# Patient Record
Sex: Female | Born: 1965 | Race: Black or African American | Hispanic: No | Marital: Single | Smoking: Current every day smoker
Health system: Southern US, Community
[De-identification: ages and names within clinical notes are randomized; demographics above are authoritative.]

## PROBLEM LIST (undated history)

## (undated) ENCOUNTER — Emergency Department (HOSPITAL_BASED_OUTPATIENT_CLINIC_OR_DEPARTMENT_OTHER): Admission: EM | Payer: Self-pay | Source: Home / Self Care

## (undated) DIAGNOSIS — F32A Depression, unspecified: Secondary | ICD-10-CM

## (undated) DIAGNOSIS — I10 Essential (primary) hypertension: Secondary | ICD-10-CM

## (undated) DIAGNOSIS — F329 Major depressive disorder, single episode, unspecified: Secondary | ICD-10-CM

## (undated) DIAGNOSIS — E785 Hyperlipidemia, unspecified: Secondary | ICD-10-CM

## (undated) HISTORY — PX: KNEE SURGERY: SHX244

---

## 2006-04-06 ENCOUNTER — Ambulatory Visit: Payer: Self-pay | Admitting: Family Medicine

## 2006-04-06 ENCOUNTER — Other Ambulatory Visit: Admission: RE | Admit: 2006-04-06 | Discharge: 2006-04-06 | Payer: Self-pay | Admitting: Obstetrics and Gynecology

## 2014-01-22 ENCOUNTER — Encounter (HOSPITAL_BASED_OUTPATIENT_CLINIC_OR_DEPARTMENT_OTHER): Payer: Self-pay

## 2014-01-22 ENCOUNTER — Emergency Department (HOSPITAL_BASED_OUTPATIENT_CLINIC_OR_DEPARTMENT_OTHER): Payer: Self-pay

## 2014-01-22 ENCOUNTER — Emergency Department (HOSPITAL_BASED_OUTPATIENT_CLINIC_OR_DEPARTMENT_OTHER)
Admission: EM | Admit: 2014-01-22 | Discharge: 2014-01-22 | Disposition: A | Discharge status: Home / Self Care | Payer: Self-pay | Attending: Emergency Medicine | Admitting: Emergency Medicine

## 2014-01-22 DIAGNOSIS — W2209XA Striking against other stationary object, initial encounter: Secondary | ICD-10-CM | POA: Insufficient documentation

## 2014-01-22 DIAGNOSIS — Y998 Other external cause status: Secondary | ICD-10-CM | POA: Insufficient documentation

## 2014-01-22 DIAGNOSIS — S62396A Other fracture of fifth metacarpal bone, right hand, initial encounter for closed fracture: Secondary | ICD-10-CM | POA: Insufficient documentation

## 2014-01-22 DIAGNOSIS — E785 Hyperlipidemia, unspecified: Secondary | ICD-10-CM | POA: Insufficient documentation

## 2014-01-22 DIAGNOSIS — S62306A Unspecified fracture of fifth metacarpal bone, right hand, initial encounter for closed fracture: Secondary | ICD-10-CM

## 2014-01-22 DIAGNOSIS — Z79899 Other long term (current) drug therapy: Secondary | ICD-10-CM | POA: Insufficient documentation

## 2014-01-22 DIAGNOSIS — I10 Essential (primary) hypertension: Secondary | ICD-10-CM | POA: Insufficient documentation

## 2014-01-22 DIAGNOSIS — Y9289 Other specified places as the place of occurrence of the external cause: Secondary | ICD-10-CM | POA: Insufficient documentation

## 2014-01-22 DIAGNOSIS — Y9389 Activity, other specified: Secondary | ICD-10-CM | POA: Insufficient documentation

## 2014-01-22 DIAGNOSIS — T1490XA Injury, unspecified, initial encounter: Secondary | ICD-10-CM

## 2014-01-22 HISTORY — DX: Essential (primary) hypertension: I10

## 2014-01-22 HISTORY — DX: Hyperlipidemia, unspecified: E78.5

## 2014-01-22 MED ORDER — OXYCODONE-ACETAMINOPHEN 5-325 MG PO TABS
2.0000 | ORAL_TABLET | Freq: Once | ORAL | Status: AC
  Administered 2014-01-22: 2 via ORAL
  Filled 2014-01-22: qty 2

## 2014-01-22 MED ORDER — LISINOPRIL 10 MG PO TABS
5.0000 mg | ORAL_TABLET | Freq: Once | ORAL | Status: AC
  Administered 2014-01-22: 5 mg via ORAL
  Filled 2014-01-22: qty 1

## 2014-01-22 MED ORDER — OXYCODONE-ACETAMINOPHEN 5-325 MG PO TABS: 1.0000 | ORAL_TABLET | Freq: Four times a day (QID) | ORAL | Status: AC | PRN

## 2014-01-22 MED ORDER — LISINOPRIL 5 MG PO TABS: 5.0000 mg | ORAL_TABLET | Freq: Every day | ORAL | Status: AC

## 2014-01-22 NOTE — ED Provider Notes (Signed)
TIME SEEN: 10:55 AM  CHIEF COMPLAINT: Right hand injury  HPI: Pt is a 48 y.o. female with history of hypertension, hyperlipidemia who is right-hand-dominant who presents to the emergency department with right hand swelling and pain after she punched a wall earlier today. No other injury. No numbness or focal weakness.  Patient is hypertensive. She is out of her lisinopril. Denies chest pain, shortness of breath, headache, vision changes.  ROS: See HPI Constitutional: no fever  Eyes: no drainage  ENT: no runny nose   Cardiovascular:  no chest pain  Resp: no SOB  GI: no vomiting GU: no dysuria Integumentary: no rash  Allergy: no hives  Musculoskeletal: no leg swelling  Neurological: no slurred speech ROS otherwise negative  PAST MEDICAL HISTORY/PAST SURGICAL HISTORY:  Past Medical History  Diagnosis Date  . Hypertension   . Hyperlipidemia     MEDICATIONS:  Prior to Admission medications   Medication Sig Start Date End Date Taking? Authorizing Provider  rosuvastatin (CRESTOR) 20 MG tablet Take 20 mg by mouth daily.   Yes Historical Provider, MD  lisinopril (PRINIVIL,ZESTRIL) 5 MG tablet Take 5 mg by mouth daily.    Historical Provider, MD    ALLERGIES:  No Known Allergies  SOCIAL HISTORY:  History  Substance Use Topics  . Smoking status: Not on file  . Smokeless tobacco: Not on file  . Alcohol Use: Not on file    FAMILY HISTORY: No family history on file.  EXAM: BP 176/120 mmHg  Pulse 82  Temp(Src) 99.1 F (37.3 C) (Oral)  Resp 16  Ht 5' 6.5" (1.689 m)  Wt 160 lb (72.576 kg)  BMI 25.44 kg/m2  SpO2 98% CONSTITUTIONAL: Alert and oriented and responds appropriately to questions. Well-appearing; well-nourished HEAD: Normocephalic EYES: Conjunctivae clear, PERRL ENT: normal nose; no rhinorrhea; moist mucous membranes; pharynx without lesions noted NECK: Supple, no meningismus, no LAD  CARD: RRR; S1 and S2 appreciated; no murmurs, no clicks, no rubs, no  gallops RESP: Normal chest excursion without splinting or tachypnea; breath sounds clear and equal bilaterally; no wheezes, no rhonchi, no rales,  ABD/GI: Normal bowel sounds; non-distended; soft, non-tender, no rebound, no guarding BACK:  The back appears normal and is non-tender to palpation, there is no CVA tenderness EXT: Tenderness and swelling and ecchymosis over the dorsal right fifth metacarpal with a small associated abrasion, no scissoring when she makes a fist, normal capillary refill, 2+ radial pulses bilaterally, normal sensation diffusely throughout the hand, no other sign of bony injury, otherwise Normal ROM in all joints; otherwise extremities are non-tender to palpation; no edema; normal capillary refill; no cyanosis    SKIN: Normal color for age and race; warm NEURO: Moves all extremities equally PSYCH: The patient's mood and manner are appropriate. Grooming and personal hygiene are appropriate.  MEDICAL DECISION MAKING: Patient here with a boxer's fracture that is comminuted with volar angulation of the distal portion. She is neurovascular intact distally. Discussed with Dr. Melvyn Novasrtmann with hand surgery. He states he can see the patient on Monday, January 4. Discussed this with patient and discussed with her that she needs to make an appointment. Discussed with her that she will need surgery for repair for this injury. We'll place an ulnar gutter splint. She is hypertensive in the emergency department but suspect this is secondary to pain and being out of lisinopril. We'll give a dose of lisinopril, Percocet and reassess blood pressure. No other injury on exam.  ED PROGRESS: Blood pressure is improved to 140/95  after pain medication and lisinopril.     SPLINT APPLICATION Date/Time: 11:15 AM Authorized by: Raelyn NumberWARD, Suriah Peragine N Consent: Verbal consent obtained. Risks and benefits: risks, benefits and alternatives were discussed Consent given by: patient Splint applied by: orthopedic  technician Location details: right hand  Splint type: Ulnar gutter  Supplies used: Fiberglass Post-procedure: The splinted body part was neurovascularly unchanged following the procedure. Patient tolerance: Patient tolerated the procedure well with no immediate complications.     Amy MawKristen N Burma Ketcher, DO 01/22/14 1220

## 2014-01-22 NOTE — Discharge Instructions (Signed)
Boxer's Fracture °You have a break (fracture) of the fifth metacarpal bone. This is commonly called a boxer's fracture. This is the bone in the hand where the little finger attaches. The fracture is in the end of that bone, closest to the little finger. It is usually caused when you hit an object with a clenched fist. Often, the knuckle is pushed down by the impact. Sometimes, the fracture rotates out of position. A boxer's fracture will usually heal within 6 weeks, if it is treated properly and protected from re-injury. Surgery is sometimes needed. °A cast, splint, or bulky hand dressing may be used to protect and immobilize a boxer's fracture. Do not remove this device or dressing until your caregiver approves. Keep your hand elevated, and apply ice packs for 15-20 minutes every 2 hours, for the first 2 days. Elevation and ice help reduce swelling and relieve pain. See your caregiver, or an orthopedic specialist, for follow-up care within the next 10 days. This is to make sure your fracture is healing properly. °Document Released: 01/10/2005 Document Revised: 04/04/2011 Document Reviewed: 06/30/2006 °ExitCare® Patient Information ©2015 [employer]. This information is not intended to replace advice given to you by your health care provider. Make sure you discuss any questions you have with your health care provider. ° °Cast or Splint Care °Casts and splints support injured limbs and keep bones from moving while they heal. It is important to care for your cast or splint at home.   °HOME CARE INSTRUCTIONS °· Keep the cast or splint uncovered during the drying period. It can take 24 to 48 hours to dry if it is made of plaster. A fiberglass cast will dry in less than 1 hour. °· Do not rest the cast on anything harder than a pillow for the first 24 hours. °· Do not put weight on your injured limb or apply pressure to the cast until your health care provider gives you permission. °· Keep the cast or splint dry. Wet  casts or splints can lose their shape and may not support the limb as well. A wet cast that has lost its shape can also create harmful pressure on your skin when it dries. Also, wet skin can become infected. °¨ Cover the cast or splint with a plastic bag when bathing or when out in the rain or snow. If the cast is on the trunk of the body, take sponge baths until the cast is removed. °¨ If your cast does become wet, dry it with a towel or a blow dryer on the cool setting only. °· Keep your cast or splint clean. Soiled casts may be wiped with a moistened cloth. °· Do not place any hard or soft foreign objects under your cast or splint, such as cotton, toilet paper, lotion, or powder. °· Do not try to scratch the skin under the cast with any object. The object could get stuck inside the cast. Also, scratching could lead to an infection. If itching is a problem, use a blow dryer on a cool setting to relieve discomfort. °· Do not trim or cut your cast or remove padding from inside of it. °· Exercise all joints next to the injury that are not immobilized by the cast or splint. For example, if you have a long leg cast, exercise the hip joint and toes. If you have an arm cast or splint, exercise the shoulder, elbow, thumb, and fingers. °· Elevate your injured arm or leg on 1 or 2 pillows for the   first 1 to 3 days to decrease swelling and pain.It is best if you can comfortably elevate your cast so it is higher than your heart. SEEK MEDICAL CARE IF:   Your cast or splint cracks.  Your cast or splint is too tight or too loose.  You have unbearable itching inside the cast.  Your cast becomes wet or develops a soft spot or area.  You have a bad smell coming from inside your cast.  You get an object stuck under your cast.  Your skin around the cast becomes red or raw.  You have new pain or worsening pain after the cast has been applied. SEEK IMMEDIATE MEDICAL CARE IF:   You have fluid leaking through the  cast.  You are unable to move your fingers or toes.  You have discolored (blue or white), cool, painful, or very swollen fingers or toes beyond the cast.  You have tingling or numbness around the injured area.  You have severe pain or pressure under the cast.  You have any difficulty with your breathing or have shortness of breath.  You have chest pain. Document Released: 01/08/2000 Document Revised: 10/31/2012 Document Reviewed: 07/19/2012 [facility]ExitCare Patient Information 2015 [place], [place]. This information is not intended to replace advice given to you by your health care provider. Make sure you discuss any questions you have with your health care provider.    RICE: Routine Care for Injuries The routine care of many injuries includes Rest, Ice, Compression, and Elevation (RICE). HOME CARE INSTRUCTIONS  Rest is needed to allow your body to heal. Routine activities can usually be resumed when comfortable. Injured tendons and bones can take up to 6 weeks to heal. Tendons are the cord-like structures that attach muscle to bone.  Ice following an injury helps keep the swelling down and reduces pain.  Put ice in a plastic bag.  Place a towel between your skin and the bag.  Leave the ice on for 15-20 minutes, 3-4 times a day, or as directed by your health care provider. Do this while awake, for the first 24 to 48 hours. After that, continue as directed by your caregiver.  Compression helps keep swelling down. It also gives support and helps with discomfort. If an elastic bandage has been applied, it should be removed and reapplied every 3 to 4 hours. It should not be applied tightly, but firmly enough to keep swelling down. Watch fingers or toes for swelling, bluish discoloration, coldness, numbness, or excessive pain. If any of these problems occur, remove the bandage and reapply loosely. Contact your caregiver if these problems continue.  Elevation helps reduce swelling and decreases  pain. With extremities, such as the arms, hands, legs, and feet, the injured area should be placed near or above the level of the heart, if possible. SEEK IMMEDIATE MEDICAL CARE IF:  You have persistent pain and swelling.  You develop redness, numbness, or unexpected weakness.  Your symptoms are getting worse rather than improving after several days. These symptoms may indicate that further evaluation or further X-rays are needed. Sometimes, X-rays may not show a small broken bone (fracture) until 1 week or 10 days later. Make a follow-up appointment with your caregiver. Ask when your X-ray results will be ready. Make sure you get your X-ray results. Document Released: 04/24/2000 Document Revised: 01/15/2013 Document Reviewed: 06/11/2010 [facility]Exit[facility] Patient Information 2015 [place]ExitCare, [place]LLC. This information is not intended to replace advice given to you by your health care provider. Make sure you discuss  any questions you have with your health care provider.   Hypertension Hypertension, commonly called high blood pressure, is when the force of blood pumping through your arteries is too strong. Your arteries are the blood vessels that carry blood from your heart throughout your body. A blood pressure reading consists of a higher number over a lower number, such as 110/72. The higher number (systolic) is the pressure inside your arteries when your heart pumps. The lower number (diastolic) is the pressure inside your arteries when your heart relaxes. Ideally you want your blood pressure below 120/80. Hypertension forces your heart to work harder to pump blood. Your arteries may become narrow or stiff. Having hypertension puts you at risk for heart disease, stroke, and other problems.  RISK FACTORS Some risk factors for high blood pressure are controllable. Others are not.  Risk factors you cannot control include:   Race. You may be at higher risk if you are African American.  Age. Risk increases with  age.  Gender. Men are at higher risk than women before age 34 years. After age 73, women are at higher risk than men. Risk factors you can control include:  Not getting enough exercise or physical activity.  Being overweight.  Getting too much fat, sugar, calories, or salt in your diet.  Drinking too much alcohol. SIGNS AND SYMPTOMS Hypertension does not usually cause signs or symptoms. Extremely high blood pressure (hypertensive crisis) may cause headache, anxiety, shortness of breath, and nosebleed. DIAGNOSIS  To check if you have hypertension, your health care provider will measure your blood pressure while you are seated, with your arm held at the level of your heart. It should be measured at least twice using the same arm. Certain conditions can cause a difference in blood pressure between your right and left arms. A blood pressure reading that is higher than normal on one occasion does not mean that you need treatment. If one blood pressure reading is high, ask your health care provider about having it checked again. TREATMENT  Treating high blood pressure includes making lifestyle changes and possibly taking medicine. Living a healthy lifestyle can help lower high blood pressure. You may need to change some of your habits. Lifestyle changes may include:  Following the DASH diet. This diet is high in fruits, vegetables, and whole grains. It is low in salt, red meat, and added sugars.  Getting at least 2 hours of brisk physical activity every week.  Losing weight if necessary.  Not smoking.  Limiting alcoholic beverages.  Learning ways to reduce stress. If lifestyle changes are not enough to get your blood pressure under control, your health care provider may prescribe medicine. You may need to take more than one. Work closely with your health care provider to understand the risks and benefits. HOME CARE INSTRUCTIONS  Have your blood pressure rechecked as directed by your  health care provider.   Take medicines only as directed by your health care provider. Follow the directions carefully. Blood pressure medicines must be taken as prescribed. The medicine does not work as well when you skip doses. Skipping doses also puts you at risk for problems.   Do not smoke.   Monitor your blood pressure at home as directed by your health care provider. SEEK MEDICAL CARE IF:   You think you are having a reaction to medicines taken.  You have recurrent headaches or feel dizzy.  You have swelling in your ankles.  You have trouble with your vision. SEEK  IMMEDIATE MEDICAL CARE IF:  You develop a severe headache or confusion.  You have unusual weakness, numbness, or feel faint.  You have severe chest or abdominal pain.  You vomit repeatedly.  You have trouble breathing. MAKE SURE YOU:   Understand these instructions.  Will watch your condition.  Will get help right away if you are not doing well or get worse. Document Released: 01/10/2005 Document Revised: 05/27/2013 Document Reviewed: 11/02/2012 [facility] HenryettaExitCare Patient Information 2015 [place]ExitCare, [place]LLC. This information is not intended to replace advice given to you by your health care provider. Make sure you discuss any questions you have with your health care provider.

## 2014-01-22 NOTE — ED Notes (Signed)
Injury to right hand after striking a wall.

## 2016-11-25 ENCOUNTER — Emergency Department (HOSPITAL_BASED_OUTPATIENT_CLINIC_OR_DEPARTMENT_OTHER)
Admission: EM | Admit: 2016-11-25 | Discharge: 2016-11-25 | Disposition: A | Discharge status: Home / Self Care | Payer: BLUE CROSS/BLUE SHIELD | Attending: Emergency Medicine | Admitting: Emergency Medicine

## 2016-11-25 ENCOUNTER — Emergency Department (HOSPITAL_BASED_OUTPATIENT_CLINIC_OR_DEPARTMENT_OTHER): Payer: BLUE CROSS/BLUE SHIELD

## 2016-11-25 ENCOUNTER — Encounter (HOSPITAL_BASED_OUTPATIENT_CLINIC_OR_DEPARTMENT_OTHER): Payer: Self-pay | Admitting: *Deleted

## 2016-11-25 DIAGNOSIS — Y92410 Unspecified street and highway as the place of occurrence of the external cause: Secondary | ICD-10-CM | POA: Diagnosis not present

## 2016-11-25 DIAGNOSIS — S00532A Contusion of oral cavity, initial encounter: Secondary | ICD-10-CM | POA: Insufficient documentation

## 2016-11-25 DIAGNOSIS — F17200 Nicotine dependence, unspecified, uncomplicated: Secondary | ICD-10-CM | POA: Insufficient documentation

## 2016-11-25 DIAGNOSIS — I10 Essential (primary) hypertension: Secondary | ICD-10-CM | POA: Insufficient documentation

## 2016-11-25 DIAGNOSIS — Y9389 Activity, other specified: Secondary | ICD-10-CM | POA: Diagnosis not present

## 2016-11-25 DIAGNOSIS — S01511A Laceration without foreign body of lip, initial encounter: Secondary | ICD-10-CM | POA: Diagnosis present

## 2016-11-25 DIAGNOSIS — Z79899 Other long term (current) drug therapy: Secondary | ICD-10-CM | POA: Insufficient documentation

## 2016-11-25 DIAGNOSIS — Y998 Other external cause status: Secondary | ICD-10-CM | POA: Diagnosis not present

## 2016-11-25 HISTORY — DX: Major depressive disorder, single episode, unspecified: F32.9

## 2016-11-25 HISTORY — DX: Depression, unspecified: F32.A

## 2016-11-25 MED ORDER — ACETAMINOPHEN 160 MG/5ML PO SOLN
650.0000 mg | Freq: Once | ORAL | Status: AC
  Administered 2016-11-25: 650 mg via ORAL
  Filled 2016-11-25: qty 20.3

## 2016-11-25 MED ORDER — HYDROCODONE-ACETAMINOPHEN 7.5-325 MG/15ML PO SOLN: 5.0000 mL | Freq: Four times a day (QID) | ORAL | 0 refills | Status: AC | PRN

## 2016-11-25 MED ORDER — IBUPROFEN 600 MG PO TABS: 600.0000 mg | ORAL_TABLET | Freq: Four times a day (QID) | ORAL | 0 refills | Status: AC | PRN

## 2016-11-25 MED ORDER — ACETAMINOPHEN 500 MG PO TABS
1000.0000 mg | ORAL_TABLET | Freq: Once | ORAL | Status: DC
  Filled 2016-11-25: qty 2

## 2016-11-25 NOTE — ED Notes (Signed)
ED Provider at bedside. 
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
